# Patient Record
Sex: Female | Born: 1960 | Race: White | Hispanic: No | Marital: Single | Smoking: Never smoker
Health system: Southern US, Community
[De-identification: ages and names within clinical notes are randomized; demographics above are authoritative.]

## PROBLEM LIST (undated history)

## (undated) DIAGNOSIS — I509 Heart failure, unspecified: Secondary | ICD-10-CM

## (undated) DIAGNOSIS — I10 Essential (primary) hypertension: Secondary | ICD-10-CM

## (undated) DIAGNOSIS — E119 Type 2 diabetes mellitus without complications: Secondary | ICD-10-CM

---

## 2014-09-18 ENCOUNTER — Other Ambulatory Visit: Payer: Self-pay | Admitting: Family Medicine

## 2014-09-18 DIAGNOSIS — N938 Other specified abnormal uterine and vaginal bleeding: Secondary | ICD-10-CM

## 2014-09-19 ENCOUNTER — Other Ambulatory Visit: Payer: Self-pay

## 2014-09-22 ENCOUNTER — Other Ambulatory Visit: Payer: Self-pay | Admitting: Family Medicine

## 2014-09-22 DIAGNOSIS — R748 Abnormal levels of other serum enzymes: Secondary | ICD-10-CM

## 2014-09-25 ENCOUNTER — Ambulatory Visit
Admission: RE | Admit: 2014-09-25 | Discharge: 2014-09-25 | Disposition: A | Discharge status: Home / Self Care | Payer: BLUE CROSS/BLUE SHIELD | Source: Ambulatory Visit | Attending: Family Medicine | Admitting: Family Medicine

## 2014-09-25 ENCOUNTER — Ambulatory Visit: Admission: RE | Admit: 2014-09-25 | Payer: BLUE CROSS/BLUE SHIELD | Source: Ambulatory Visit

## 2014-09-25 DIAGNOSIS — R748 Abnormal levels of other serum enzymes: Secondary | ICD-10-CM

## 2014-09-25 DIAGNOSIS — N938 Other specified abnormal uterine and vaginal bleeding: Secondary | ICD-10-CM | POA: Insufficient documentation

## 2014-09-25 DIAGNOSIS — D259 Leiomyoma of uterus, unspecified: Secondary | ICD-10-CM | POA: Insufficient documentation

## 2020-10-04 ENCOUNTER — Other Ambulatory Visit: Payer: Self-pay

## 2020-10-04 ENCOUNTER — Emergency Department: Payer: BLUE CROSS/BLUE SHIELD

## 2020-10-04 ENCOUNTER — Encounter: Payer: Self-pay | Admitting: *Deleted

## 2020-10-04 DIAGNOSIS — U071 COVID-19: Secondary | ICD-10-CM | POA: Insufficient documentation

## 2020-10-04 DIAGNOSIS — E119 Type 2 diabetes mellitus without complications: Secondary | ICD-10-CM | POA: Insufficient documentation

## 2020-10-04 DIAGNOSIS — I10 Essential (primary) hypertension: Secondary | ICD-10-CM | POA: Insufficient documentation

## 2020-10-04 LAB — CBC
HCT: 37.4 % (ref 36.0–46.0)
Hemoglobin: 12.4 g/dL (ref 12.0–15.0)
MCH: 30.3 pg (ref 26.0–34.0)
MCHC: 33.2 g/dL (ref 30.0–36.0)
MCV: 91.4 fL (ref 80.0–100.0)
Platelets: 240 10*3/uL (ref 150–400)
RBC: 4.09 MIL/uL (ref 3.87–5.11)
RDW: 12.8 % (ref 11.5–15.5)
WBC: 7.4 10*3/uL (ref 4.0–10.5)
nRBC: 0 % (ref 0.0–0.2)

## 2020-10-04 LAB — BASIC METABOLIC PANEL
Anion gap: 10 (ref 5–15)
BUN: 6 mg/dL (ref 6–20)
CO2: 28 mmol/L (ref 22–32)
Calcium: 9 mg/dL (ref 8.9–10.3)
Chloride: 102 mmol/L (ref 98–111)
Creatinine, Ser: 0.65 mg/dL (ref 0.44–1.00)
GFR, Estimated: >60 mL/min (ref >60)
Glucose, Bld: 186 mg/dL — ABNORMAL HIGH (ref 70–99)
Potassium: 3.6 mmol/L (ref 3.5–5.1)
Sodium: 140 mmol/L (ref 135–145)

## 2020-10-04 LAB — TROPONIN I (HIGH SENSITIVITY): Troponin I (High Sensitivity): 6 ng/L (ref <18)

## 2020-10-04 NOTE — ED Triage Notes (Addendum)
Pt ambulatory to triage.  Pt has sob   Pt reports positive  For covid.  No chest pain.  No n/v/d.  No fever.  Pt alert  speech clear.  Sx for 3 days

## 2020-10-05 ENCOUNTER — Emergency Department
Admission: EM | Admit: 2020-10-05 | Discharge: 2020-10-05 | Disposition: A | Discharge status: Home / Self Care | Payer: BLUE CROSS/BLUE SHIELD | Attending: Emergency Medicine | Admitting: Emergency Medicine

## 2020-10-05 DIAGNOSIS — U071 COVID-19: Secondary | ICD-10-CM

## 2020-10-05 LAB — RESP PANEL BY RT-PCR (FLU A&B, COVID) ARPGX2
Influenza A by PCR: NEGATIVE
Influenza B by PCR: NEGATIVE
SARS Coronavirus 2 by RT PCR: POSITIVE — AB

## 2020-10-05 LAB — TROPONIN I (HIGH SENSITIVITY): Troponin I (High Sensitivity): 6 ng/L (ref <18)

## 2020-10-05 MED ORDER — HYDROCOD POLST-CPM POLST ER 10-8 MG/5ML PO SUER
5.0000 mL | Freq: Once | ORAL | Status: AC
  Administered 2020-10-05: 5 mL via ORAL
  Filled 2020-10-05: qty 5

## 2020-10-05 MED ORDER — HYDROCOD POLST-CPM POLST ER 10-8 MG/5ML PO SUER: 5.0000 mL | Freq: Two times a day (BID) | ORAL | 0 refills | Status: AC

## 2020-10-05 MED ORDER — ALBUTEROL SULFATE HFA 108 (90 BASE) MCG/ACT IN AERS: 2.0000 | INHALATION_SPRAY | RESPIRATORY_TRACT | 0 refills | Status: AC | PRN

## 2020-10-05 MED ORDER — NIRMATRELVIR/RITONAVIR (PAXLOVID)TABLET: 3.0000 | ORAL_TABLET | Freq: Two times a day (BID) | ORAL | 0 refills | Status: AC

## 2020-10-05 NOTE — ED Provider Notes (Signed)
[facility] Emergency Department Provider Note   ____________________________________________   Event Date/Time   First MD Initiated Contact with Patient 10/05/20 [phone]     (approximate)  I have reviewed the triage vital signs and the nursing notes.   HISTORY  Chief Complaint Shortness of Breath    HPI Monica Santana is a 60 y.o. female who presents to the ED from home with a chief complaint of cough and shortness of breath.  Patient received her second COVID-19 booster shot by her PCP 10/02/2020.  Onset of symptoms that evening.  Took a home COVID antigen test tonight which was positive.  Denies fever, chills, chest pain, abdominal pain, nausea, vomiting or diarrhea.     Past medical history Diabetes Hypertension Hyperlipidemia  There are no problems to display for this patient.   Prior to Admission medications   Medication Sig Start Date End Date Taking? Authorizing Provider  albuterol (VENTOLIN HFA) 108 (90 Base) MCG/ACT inhaler Inhale 2 puffs into the lungs every 4 (four) hours as needed for wheezing or shortness of breath. 10/05/20  Yes Paulette Blanch, MD  chlorpheniramine-HYDROcodone (TUSSIONEX PENNKINETIC ER) 10-8 MG/5ML SUER Take 5 mLs by mouth 2 (two) times daily. 10/05/20  Yes Paulette Blanch, MD  nirmatrelvir/ritonavir EUA (PAXLOVID) TABS Take 3 tablets by mouth 2 (two) times daily for 5 days. Patient GFR is >60. Take nirmatrelvir (150 mg) two tablets twice daily for 5 days and ritonavir (100 mg) one tablet twice daily for 5 days. 10/05/20 10/10/20 Yes Paulette Blanch, MD    Allergies Erythromycin  No family history on file.  Social History Social History   Tobacco Use  . Smoking status: Never Smoker  . Smokeless tobacco: Never Used    Review of Systems  Constitutional: No fever/chills Eyes: No visual changes. ENT: No sore throat. Cardiovascular: Denies chest pain. Respiratory: Positive for cough and shortness of  breath. Gastrointestinal: No abdominal pain.  No nausea, no vomiting.  No diarrhea.  No constipation. Genitourinary: Negative for dysuria. Musculoskeletal: Negative for back pain. Skin: Negative for rash. Neurological: Negative for headaches, focal weakness or numbness.   ____________________________________________   PHYSICAL EXAM:  VITAL SIGNS: ED Triage Vitals  Enc Vitals Group     BP 10/04/20 2243 (!) 186/87     Pulse Rate 10/04/20 2243 87     Resp 10/04/20 2243 (!) 22     Temp 10/04/20 2243 98.6 F (37 C)     Temp Source 10/04/20 2243 Oral     SpO2 10/04/20 2243 95 %     Weight 10/04/20 2244 207 lb (93.9 kg)     Height 10/04/20 2244 5' (1.524 m)     Head Circumference --      Peak Flow --      Pain Score --      Pain Loc --      Pain Edu? --      Excl. in [place]? --     Constitutional: Alert and oriented. Well appearing and in no acute distress. Eyes: Conjunctivae are normal. PERRL. EOMI. Head: Atraumatic. Nose: No congestion/rhinnorhea. Mouth/Throat: Mucous membranes are moist.   Neck: No stridor.  Supple neck without meningismus. Cardiovascular: Normal rate, regular rhythm. Grossly normal heart sounds.  Good peripheral circulation. Respiratory: Normal respiratory effort.  No retractions. Lungs CTAB. Gastrointestinal: Soft and nontender to light or deep palpation. No distention. No abdominal bruits. No CVA tenderness. Musculoskeletal: No lower extremity tenderness nor edema.  No joint effusions. Neurologic:  Normal speech and language. No gross focal neurologic deficits are appreciated. No gait instability. Skin:  Skin is warm, dry and intact. No rash noted.  No petechiae. Psychiatric: Mood and affect are normal. Speech and behavior are normal.  ____________________________________________   LABS (all labs ordered are listed, but only abnormal results are displayed)  Labs Reviewed  RESP PANEL BY RT-PCR (FLU A&B, COVID) ARPGX2 - Abnormal; Notable for the  following components:      Result Value   SARS Coronavirus 2 by RT PCR POSITIVE (*)    All other components within normal limits  BASIC METABOLIC PANEL - Abnormal; Notable for the following components:   Glucose, Bld 186 (*)    All other components within normal limits  CBC  TROPONIN I (HIGH SENSITIVITY)  TROPONIN I (HIGH SENSITIVITY)   ____________________________________________  EKG  ED ECG REPORT I, Vearl Aitken J, the attending physician, personally viewed and interpreted this ECG.   Date: 10/05/2020  EKG Time: 2246  Rate: 85  Rhythm: normal EKG, normal sinus rhythm  Axis: Normal  Intervals:none  ST&T Change: Nonspecific  ____________________________________________  RADIOLOGY I, Jameah Rouser J, personally viewed and evaluated these images (plain radiographs) as part of my medical decision making, as well as reviewing the written report by the radiologist.  ED MD interpretation: No acute cardiopulmonary process  Official radiology report(s): DG Chest 2 View  Result Date: 10/04/2020 CLINICAL DATA:  Shortness of breath.  COVID positive. EXAM: CHEST - 2 VIEW COMPARISON:  None. FINDINGS: The cardiomediastinal contours are normal. The lungs are clear. Pulmonary vasculature is normal. No consolidation, pleural effusion, or pneumothorax. No acute osseous abnormalities are seen. IMPRESSION: No acute chest findings. Electronically Signed   By: Keith Rake M.D.   On: 10/04/2020 23:07    ____________________________________________   PROCEDURES  Procedure(s) performed (including Critical Care):  Procedures   ____________________________________________   INITIAL IMPRESSION / ASSESSMENT AND PLAN / ED COURSE  As part of my medical decision making, I reviewed the following data within the [place] notes reviewed and incorporated, Labs reviewed, EKG interpreted, Old chart reviewed, Radiograph reviewed and Notes from prior ED visits      60 year old female presenting with cough and shortness of breath. Differential includes, but is not limited to, viral syndrome, bronchitis including COPD exacerbation, pneumonia, reactive airway disease including asthma, CHF including exacerbation with or without pulmonary/interstitial edema, pneumothorax, ACS, thoracic trauma, and pulmonary embolism.  Patient received second COVID-19 booster prior to onset of symptoms.  Home COVID-19 antigen test positive.  Negative chest x-ray.  Will obtain PCR test and start Paxlovid if positive.  Clinical Course as of 10/05/20 1610  Fri Oct 05, 2020  [id number] Confirmatory positive COVID test.  Will discharge home with prescription for Paxilovid, Tussionex and albuterol inhaler.  Patient will follow up closely with her PCP.  Strict return precautions given.  Patient verbalizes understanding agrees with plan of care. [JS]    Clinical Course User Index [JS] Paulette Blanch, MD     ____________________________________________   FINAL CLINICAL IMPRESSION(S) / ED DIAGNOSES  Final diagnoses:  [id number]     ED Discharge Orders         Ordered    nirmatrelvir/ritonavir EUA (PAXLOVID) TABS  2 times daily        10/05/20 0551    albuterol (VENTOLIN HFA) 108 (90 Base) MCG/ACT inhaler  Every 4 hours PRN        10/05/20 0551    chlorpheniramine-HYDROcodone (TUSSIONEX PENNKINETIC ER)  10-8 MG/5ML SUER  2 times daily        10/05/20 0551          *Please note:  Monica Santana was evaluated in Emergency Department on 10/05/2020 for the symptoms described in the history of present illness. She was evaluated in the context of the global COVID-19 pandemic, which necessitated consideration that the patient might be at risk for infection with the SARS-CoV-2 virus that causes COVID-19. Institutional protocols and algorithms that pertain to the evaluation of patients at risk for COVID-19 are in a state of rapid change based on information released by regulatory bodies  including the CDC and federal and state organizations. These policies and algorithms were followed during the patient's care in the ED.  Some ED evaluations and interventions may be delayed as a result of limited staffing during and the pandemic.*   Note:  This document was prepared using Dragon voice recognition software and may include unintentional dictation errors.   Paulette Blanch, MD 10/05/20 [phone]

## 2020-10-05 NOTE — Discharge Instructions (Signed)
1.  Quarantine yourself x5 days, then wear a mask for an additional 5 days while around people. 2.  Take Paxlovid as prescribed. 3.  You may take Tussionex as needed for cough. 4.  You may use Albuterol inhaler 2 puffs every 4 hours as needed for cough/difficulty breathing. 5.  Return to the ER for worsening symptoms, persistent vomiting, difficulty breathing or other concerns.

## 2020-10-05 NOTE — ED Notes (Signed)
Discharge instructions including COVID quarantine, prescription, and follow up care discussed with pt. Pt verbalized understanding with no questions at this time. Pt to go home with daughter.

## 2021-05-04 ENCOUNTER — Other Ambulatory Visit: Payer: Self-pay

## 2021-05-04 ENCOUNTER — Emergency Department
Admission: EM | Admit: 2021-05-04 | Discharge: 2021-05-04 | Disposition: A | Discharge status: Home / Self Care | Payer: Medicaid Other | Attending: Emergency Medicine | Admitting: Emergency Medicine

## 2021-05-04 ENCOUNTER — Emergency Department: Payer: Medicaid Other

## 2021-05-04 ENCOUNTER — Telehealth: Payer: Self-pay | Admitting: Nurse Practitioner

## 2021-05-04 DIAGNOSIS — I110 Hypertensive heart disease with heart failure: Secondary | ICD-10-CM | POA: Insufficient documentation

## 2021-05-04 DIAGNOSIS — Z79899 Other long term (current) drug therapy: Secondary | ICD-10-CM | POA: Insufficient documentation

## 2021-05-04 DIAGNOSIS — E119 Type 2 diabetes mellitus without complications: Secondary | ICD-10-CM | POA: Insufficient documentation

## 2021-05-04 DIAGNOSIS — I509 Heart failure, unspecified: Secondary | ICD-10-CM | POA: Insufficient documentation

## 2021-05-04 DIAGNOSIS — R519 Headache, unspecified: Secondary | ICD-10-CM | POA: Insufficient documentation

## 2021-05-04 HISTORY — DX: Type 2 diabetes mellitus without complications: E11.9

## 2021-05-04 HISTORY — DX: Heart failure, unspecified: I50.9

## 2021-05-04 HISTORY — DX: Essential (primary) hypertension: I10

## 2021-05-04 LAB — CBC WITH DIFFERENTIAL/PLATELET
Abs Immature Granulocytes: 0.03 10*3/uL (ref 0.00–0.07)
Basophils Absolute: 0.1 10*3/uL (ref 0.0–0.1)
Basophils Relative: 1 %
Eosinophils Absolute: 0.3 10*3/uL (ref 0.0–0.5)
Eosinophils Relative: 3 %
HCT: 40.7 % (ref 36.0–46.0)
Hemoglobin: 13.6 g/dL (ref 12.0–15.0)
Immature Granulocytes: 0 %
Lymphocytes Relative: 28 %
Lymphs Abs: 3 10*3/uL (ref 0.7–4.0)
MCH: 30 pg (ref 26.0–34.0)
MCHC: 33.4 g/dL (ref 30.0–36.0)
MCV: 89.8 fL (ref 80.0–100.0)
Monocytes Absolute: 0.5 10*3/uL (ref 0.1–1.0)
Monocytes Relative: 5 %
Neutro Abs: 7 10*3/uL (ref 1.7–7.7)
Neutrophils Relative %: 63 %
Platelets: 311 10*3/uL (ref 150–400)
RBC: 4.53 MIL/uL (ref 3.87–5.11)
RDW: 13.7 % (ref 11.5–15.5)
WBC: 10.9 10*3/uL — ABNORMAL HIGH (ref 4.0–10.5)
nRBC: 0 % (ref 0.0–0.2)

## 2021-05-04 LAB — COMPREHENSIVE METABOLIC PANEL
ALT: 20 U/L (ref 0–44)
AST: 17 U/L (ref 15–41)
Albumin: 4 g/dL (ref 3.5–5.0)
Alkaline Phosphatase: 130 U/L — ABNORMAL HIGH (ref 38–126)
Anion gap: 8 (ref 5–15)
BUN: 14 mg/dL (ref 6–20)
CO2: 27 mmol/L (ref 22–32)
Calcium: 9.9 mg/dL (ref 8.9–10.3)
Chloride: 98 mmol/L (ref 98–111)
Creatinine, Ser: 0.5 mg/dL (ref 0.44–1.00)
GFR, Estimated: >60 mL/min (ref >60)
Glucose, Bld: 123 mg/dL — ABNORMAL HIGH (ref 70–99)
Potassium: 3.8 mmol/L (ref 3.5–5.1)
Sodium: 133 mmol/L — ABNORMAL LOW (ref 135–145)
Total Bilirubin: 0.8 mg/dL (ref 0.3–1.2)
Total Protein: 7.9 g/dL (ref 6.5–8.1)

## 2021-05-04 LAB — SEDIMENTATION RATE: Sed Rate: 24 mm/hr (ref 0–30)

## 2021-05-04 MED ORDER — PROMETHAZINE HCL 25 MG/ML IJ SOLN
25.0000 mg | Freq: Once | INTRAMUSCULAR | Status: AC
  Administered 2021-05-04: 22:00:00 25 mg via INTRAMUSCULAR
  Filled 2021-05-04: qty 1

## 2021-05-04 MED ORDER — KETOROLAC TROMETHAMINE 30 MG/ML IJ SOLN
30.0000 mg | Freq: Once | INTRAMUSCULAR | Status: AC
  Administered 2021-05-04: 22:00:00 30 mg via INTRAMUSCULAR
  Filled 2021-05-04: qty 1

## 2021-05-04 MED ORDER — DIPHENHYDRAMINE HCL 50 MG/ML IJ SOLN
50.0000 mg | Freq: Once | INTRAMUSCULAR | Status: AC
  Administered 2021-05-04: 22:00:00 50 mg via INTRAMUSCULAR
  Filled 2021-05-04: qty 1

## 2021-05-04 MED ORDER — BUTALBITAL-APAP-CAFFEINE 50-325-40 MG PO TABS: 1.0000 | ORAL_TABLET | Freq: Four times a day (QID) | ORAL | 0 refills | Status: AC | PRN

## 2021-05-04 NOTE — ED Provider Notes (Signed)
[facility] Emergency Department Provider Note  ____________________________________________  Time seen: Approximately 7:17 PM  I have reviewed the triage vital signs and the nursing notes.   HISTORY  Chief Complaint Headache    HPI Monica Santana is a 60 y.o. female who presents to the emergency department complaining of a left temporal headache.  Patient states that the headache is intermittent in nature,, sharp, feels like it is located in the left temple region.  No visual changes.  No headache syndromes.  No head trauma.  Patient denies any weakness, numbness, difficulty formulating words or thoughts.  No history of CVA.  She does have history of CHF, diabetes and hypertension.  Patient denies any complaints to include neck pain, URI symptoms, chest pain, shortness of breath, GI complaints.       Past Medical History:  Diagnosis Date   CHF (congestive heart failure) (HCC)    Diabetes mellitus without complication ([place])    Hypertension     There are no problems to display for this patient.   Past Surgical History:  Procedure Laterality Date   HAND SURGERY Bilateral     Prior to Admission medications   Medication Sig Start Date End Date Taking? Authorizing Provider  butalbital-acetaminophen-caffeine (FIORICET) 50-325-40 MG tablet Take 1 tablet by mouth every 6 (six) hours as needed for headache. 05/04/21 05/04/22 Yes Giara Mcgaughey, Charline Bills, PA-C  albuterol (VENTOLIN HFA) 108 (90 Base) MCG/ACT inhaler Inhale 2 puffs into the lungs every 4 (four) hours as needed for wheezing or shortness of breath. 10/05/20   Paulette Blanch, MD  chlorpheniramine-HYDROcodone (TUSSIONEX PENNKINETIC ER) 10-8 MG/5ML SUER Take 5 mLs by mouth 2 (two) times daily. 10/05/20   Paulette Blanch, MD    Allergies Erythromycin and Lisinopril  No family history on file.  Social History Social History   Tobacco Use   Smoking status: Never   Smokeless tobacco: Never     Review  of Systems  Constitutional: No fever/chills Eyes: No visual changes. No discharge ENT: No upper respiratory complaints. Cardiovascular: no chest pain. Respiratory: no cough. No SOB. Gastrointestinal: No abdominal pain.  No nausea, no vomiting.  No diarrhea.  No constipation. Musculoskeletal: Negative for musculoskeletal pain. Skin: Negative for rash, abrasions, lacerations, ecchymosis. Neurological: Left temporal headache focal weakness or numbness.  10 System ROS otherwise negative.  ____________________________________________   PHYSICAL EXAM:  VITAL SIGNS: ED Triage Vitals  Enc Vitals Group     BP 05/04/21 1619 (!) 151/80     Pulse Rate 05/04/21 1619 91     Resp 05/04/21 1619 18     Temp 05/04/21 1619 98.2 F (36.8 C)     Temp Source 05/04/21 1619 Oral     SpO2 05/04/21 1619 93 %     Weight 05/04/21 1615 188 lb (85.3 kg)     Height 05/04/21 1615 5' (1.524 m)     Head Circumference --      Peak Flow --      Pain Score 05/04/21 1614 0     Pain Loc --      Pain Edu? --      Excl. in [place]? --      Constitutional: Alert and oriented. Well appearing and in no acute distress. Eyes: Conjunctivae are normal. PERRL. EOMI. Head: Atraumatic.  Nontender to palpation along left temporal region.  No palpable lesions or cords. ENT:      Ears:       Nose: No congestion/rhinnorhea.  Mouth/Throat: Mucous membranes are moist.  Neck: No stridor.  No cervical spine tenderness to palpation. No bruits  Cardiovascular: Normal rate, regular rhythm. Normal S1 and S2.  Good peripheral circulation. Respiratory: Normal respiratory effort without tachypnea or retractions. Lungs CTAB. Good air entry to the bases with no decreased or absent breath sounds. Musculoskeletal: Full range of motion to all extremities. No gross deformities appreciated. Neurologic:  Normal speech and language. No gross focal neurologic deficits are appreciated.  Cranial nerves II through XII grossly intact Skin:  Skin  is warm, dry and intact. No rash noted. Psychiatric: Mood and affect are normal. Speech and behavior are normal. Patient exhibits appropriate insight and judgement.   ____________________________________________   LABS (all labs ordered are listed, but only abnormal results are displayed)  Labs Reviewed  COMPREHENSIVE METABOLIC PANEL - Abnormal; Notable for the following components:      Result Value   Sodium 133 (*)    Glucose, Bld 123 (*)    Alkaline Phosphatase 130 (*)    All other components within normal limits  CBC WITH DIFFERENTIAL/PLATELET - Abnormal; Notable for the following components:   WBC 10.9 (*)    All other components within normal limits  SEDIMENTATION RATE  C-REACTIVE PROTEIN   ____________________________________________  EKG   ____________________________________________  RADIOLOGY I personally viewed and evaluated these images as part of my medical decision making, as well as reviewing the written report by the radiologist.  ED Provider Interpretation: No acute findings on CT head.  CT HEAD WO CONTRAST (5MM)  Result Date: 05/04/2021 CLINICAL DATA:  Sharp intermittent left-sided head pain EXAM: CT HEAD WITHOUT CONTRAST TECHNIQUE: Contiguous axial images were obtained from the base of the skull through the vertex without intravenous contrast. COMPARISON:  None. FINDINGS: Brain: No evidence of acute infarction, hemorrhage, hydrocephalus, extra-axial collection or significant mass effect. Partially calcified 1 cm lesion extends from the posterior falx along the left parieto-occipital lobe, most consistent with a meningioma. Vascular: No hyperdense vessel or unexpected calcification. Skull: Normal. Negative for fracture or focal lesion. Sinuses/Orbits: Visualized portions of the paranasal sinuses and mastoid air cells are predominantly clear. Orbits are unremarkable. Other: None IMPRESSION: 1. No acute intracranial findings, specifically no acute intracranial  hemorrhage. 2. Partially calcified 1 cm lesion along the posterior falx along the left parieto-occipital lobe, most consistent with a meningioma. Electronically Signed   By: Dahlia Bailiff M.D.   On: 05/04/2021 17:03    ____________________________________________    PROCEDURES  Procedure(s) performed:    Procedures    Medications  ketorolac (TORADOL) 30 MG/ML injection 30 mg (has no administration in time range)  promethazine (PHENERGAN) injection 25 mg (has no administration in time range)  diphenhydrAMINE (BENADRYL) injection 50 mg (has no administration in time range)     ____________________________________________   INITIAL IMPRESSION / ASSESSMENT AND PLAN / ED COURSE  Pertinent labs & imaging results that were available during my care of the patient were reviewed by me and considered in my medical decision making (see chart for details).  Review of the [place] CSRS was performed in accordance of the [place] prior to dispensing any controlled drugs.           Patient's diagnosis is consistent with headache.  Patient presents to the emergency department complaining of left temporal headache.  She denies any trauma.  All the headache symptoms are in the left temporal region.  There is no palpable lesions or cords.  Given the location I did evaluate the patient  with labs and imaging.  Imaging is reassuring with no acute intracranial hemorrhage or CVA.  Incidental finding of meningioma to follow-up with primary care.  Patient has reassuring labs with no elevation in the ESR.  As such I will treat for headache here emergency department, referred to primary care for follow-up.  Patient is given Fioricet for any return of headache.  Return precautions discussed with the patient..  Patient is given ED precautions to return to the ED for any worsening or new symptoms.     ____________________________________________  FINAL CLINICAL IMPRESSION(S) / ED DIAGNOSES  Final diagnoses:   Bad headache      NEW MEDICATIONS STARTED DURING THIS VISIT:  ED Discharge Orders          Ordered    butalbital-acetaminophen-caffeine (FIORICET) 50-325-40 MG tablet  Every 6 hours PRN        05/04/21 2136                This chart was dictated using voice recognition software/Dragon. Despite best efforts to proofread, errors can occur which can change the meaning. Any change was purely unintentional.    Brynda Peon 05/04/21 2145    Blake Divine, MD 05/04/21 2232

## 2021-05-04 NOTE — ED Triage Notes (Signed)
Pt states she has been having a sharp pain on the L side of her head since yesterday that comes and goes- pt denies hx of migraines- pt denies any cough, fever, congestion- pt states she took some excedrin a couple hours ago and it helped the pain but it did not go away completely- pt is worried d/t her sister recently died of an ischemic stroke

## 2021-05-05 LAB — C-REACTIVE PROTEIN: CRP: 1.9 mg/dL — ABNORMAL HIGH (ref <1.0)

## 2021-10-08 ENCOUNTER — Other Ambulatory Visit: Payer: Self-pay

## 2023-04-18 IMAGING — CT CT HEAD W/O CM
4 series · 17 of 47 positions shown, 19 images · non-contrast
Comparison: None.

CLINICAL DATA: Sharp intermittent left-sided head pain

EXAM:
CT HEAD WITHOUT CONTRAST
TECHNIQUE: Contiguous axial images were obtained from the base of the skull
through the vertex without intravenous contrast.

[Series 2: head wo · axial · 0.41mm/px · z∈[+430,+550]mm · 7 of 33 slices shown, 9 images]
[im 5/33  brain]
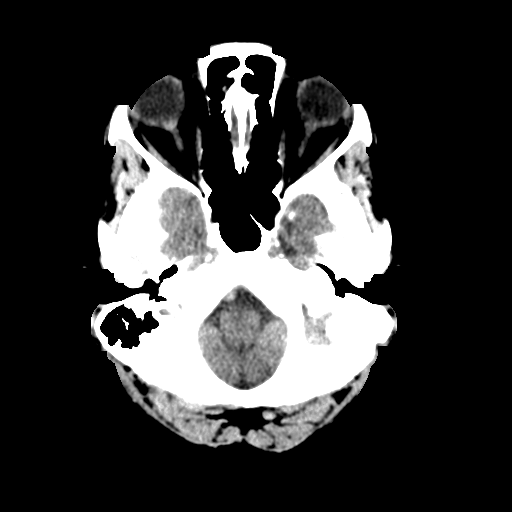
[im 5/33  bone]
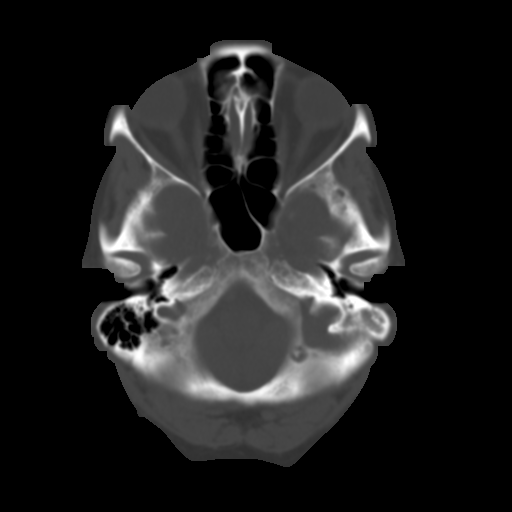
[im 9/33  brain]
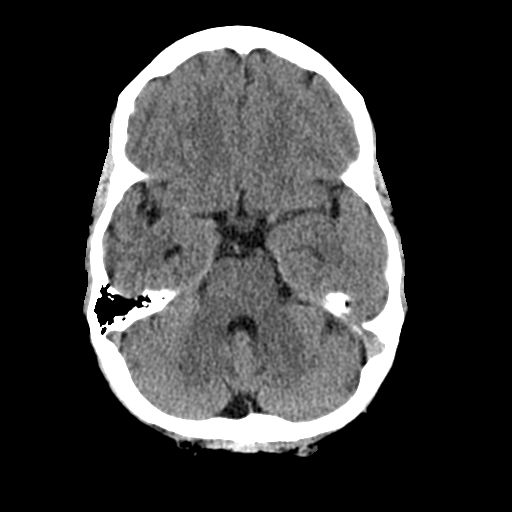
[im 13/33  brain]
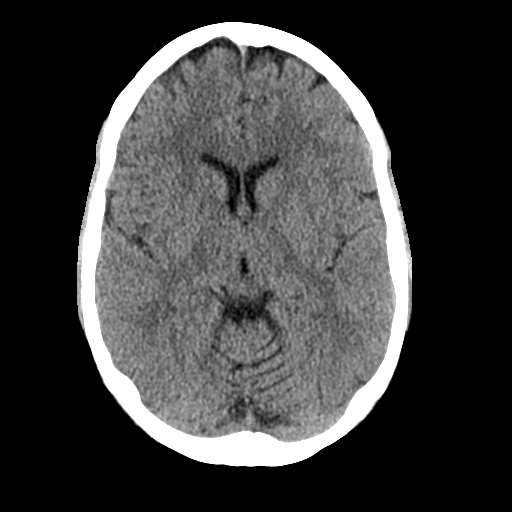
[im 17/33  brain]
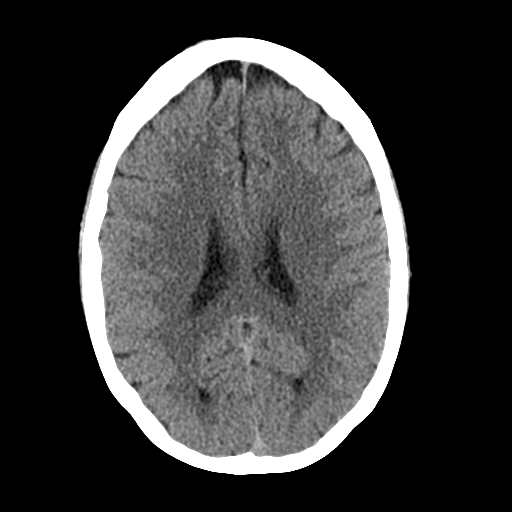
[im 21/33  brain]
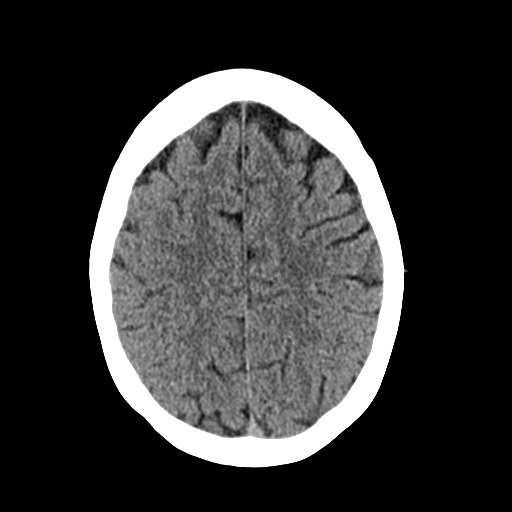
[im 21/33  bone]
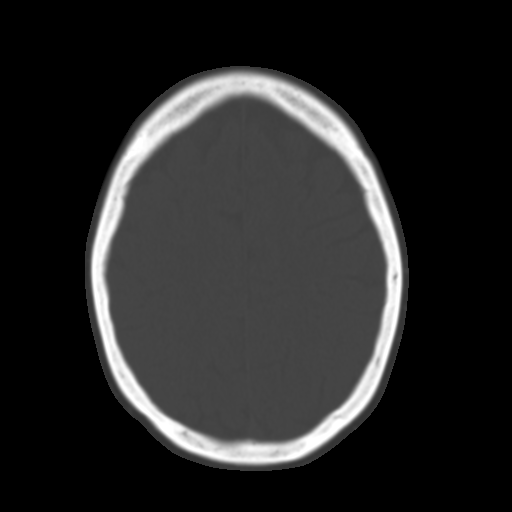
[im 25/33  brain]
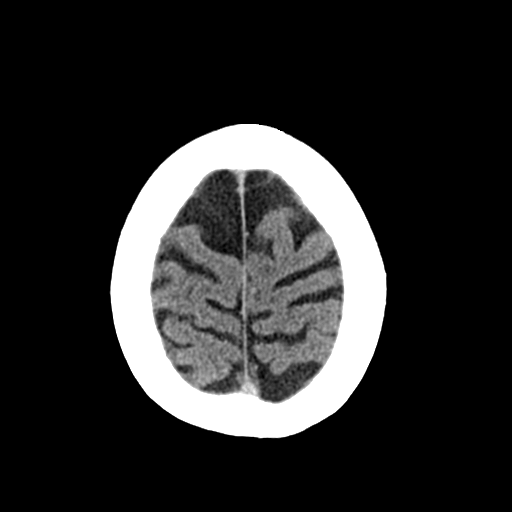
[im 29/33  brain]
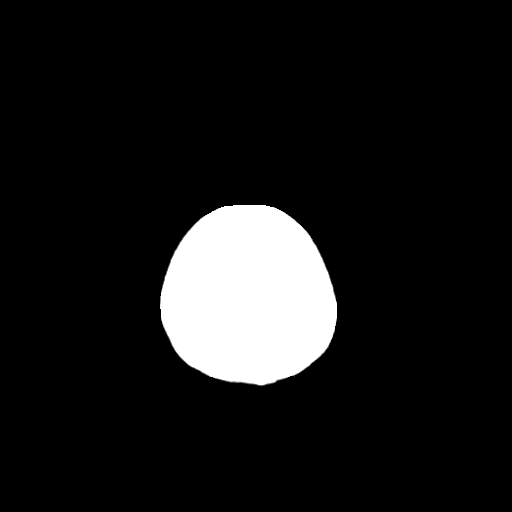

[Series 3: head bone · axial · 0.41mm/px · z∈[+426,+482]mm · 4 of 81 slices shown]
[im 9/81  bone]
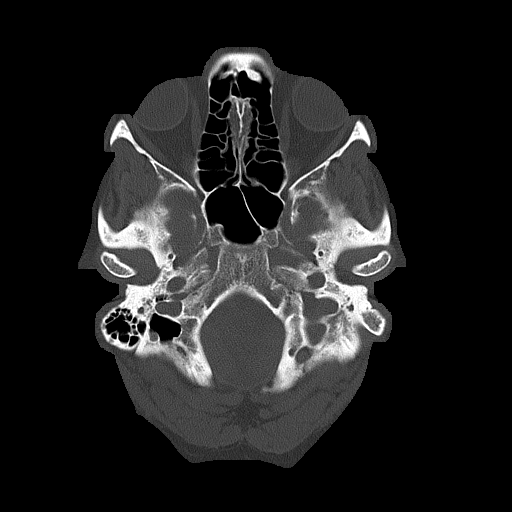
[im 17/81  bone]
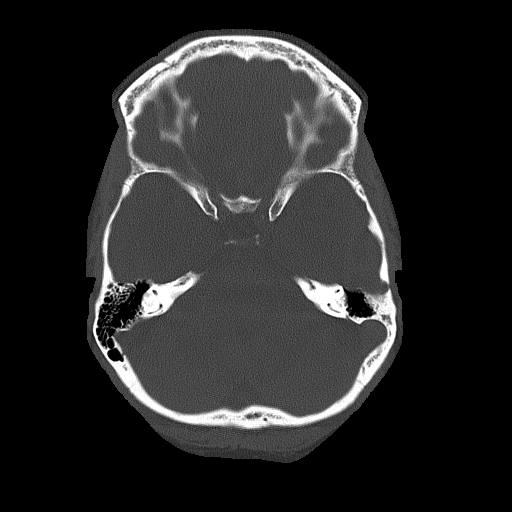
[im 25/81  bone]
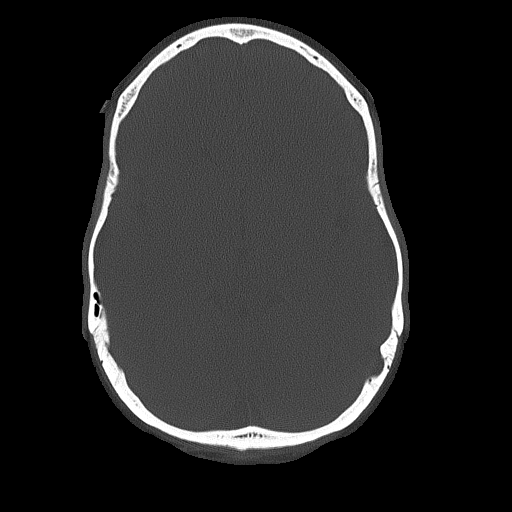
[im 37/81  bone]
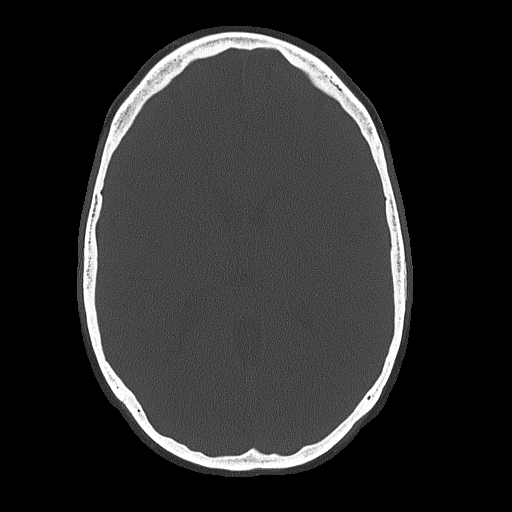

[Series 4: coronal soft tissue · coronal · 0.35mm/px · 3 of 70 slices shown]
[im 24/70  brain]
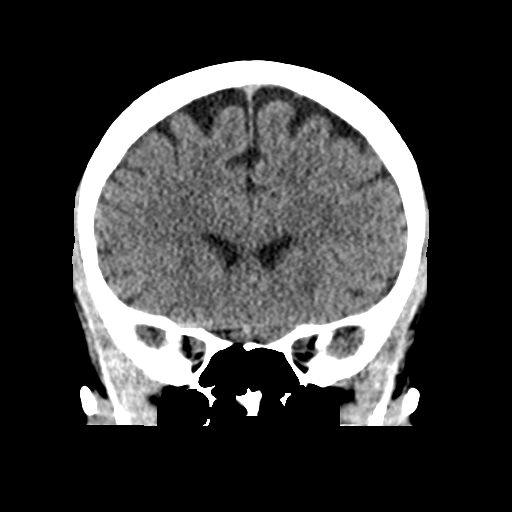
[im 31/70  brain]
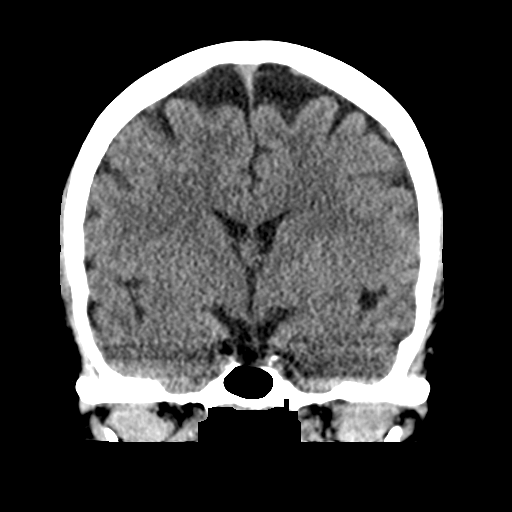
[im 39/70  brain]
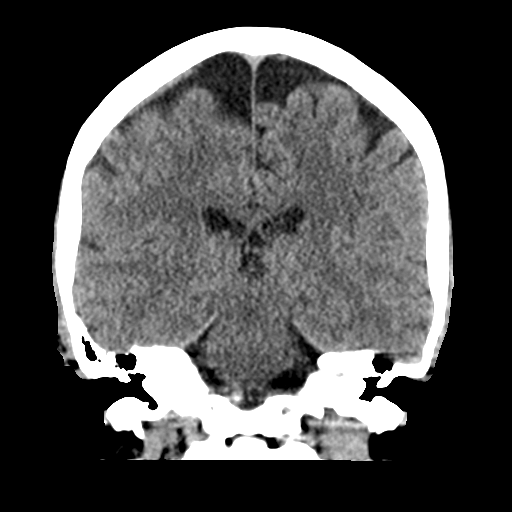

[Series 5: sagittal soft tissue · sagittal · 0.36mm/px · 3 of 56 slices shown]
[im 19/56  brain]
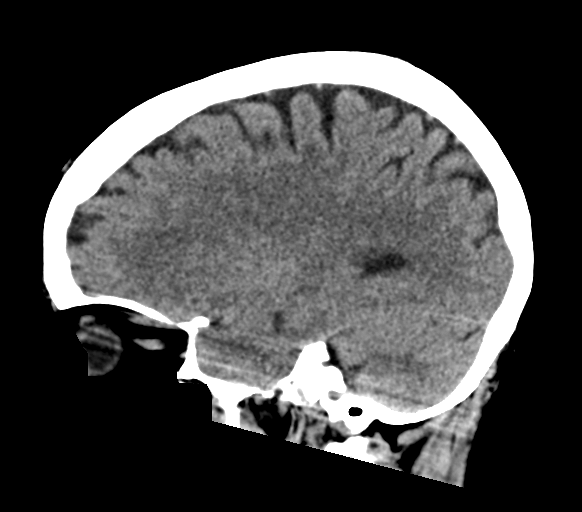
[im 28/56  brain]
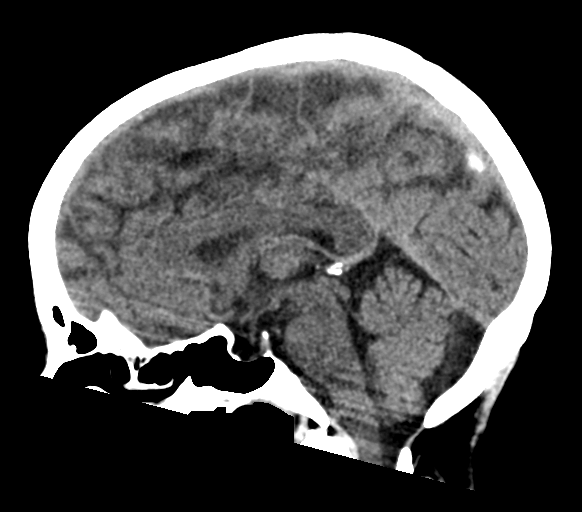
[im 37/56  brain]
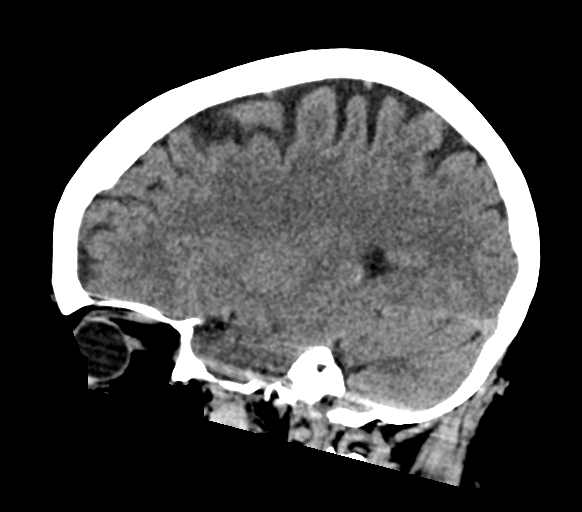

[17 of 47 positions shown; findings below may reference images not displayed]

FINDINGS: Brain: No evidence of acute infarction, hemorrhage, hydrocephalus,
extra-axial collection or significant mass effect. Partially
calcified 1 cm lesion extends from the posterior falx along the left
parieto-occipital lobe, most consistent with a meningioma.

Vascular: No hyperdense vessel or unexpected calcification.

Skull: Normal. Negative for fracture or focal lesion.

Sinuses/Orbits: Visualized portions of the paranasal sinuses and
mastoid air cells are predominantly clear. Orbits are unremarkable.

Other: None
IMPRESSION: 1. No acute intracranial findings, specifically no acute
intracranial hemorrhage.
2. Partially calcified 1 cm lesion along the posterior falx along
the left parieto-occipital lobe, most consistent with a meningioma.
